# Patient Record
Sex: Male | Born: 1995 | Race: White | Hispanic: No | Marital: Single | Smoking: Never smoker
Health system: Southern US, Community
[De-identification: ages and names within clinical notes are randomized; demographics above are authoritative.]

## PROBLEM LIST (undated history)

## (undated) HISTORY — PX: COSMETIC SURGERY: SHX468

---
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
## 2017-01-31 ENCOUNTER — Ambulatory Visit (HOSPITAL_COMMUNITY)
Admission: AD | Admit: 2017-01-31 | Discharge: 2017-01-31 | Disposition: A | Discharge status: Home / Self Care | Payer: Medicaid Other | Attending: Psychiatry | Admitting: Psychiatry

## 2017-01-31 DIAGNOSIS — F339 Major depressive disorder, recurrent, unspecified: Secondary | ICD-10-CM | POA: Diagnosis present
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
## 2017-01-31 NOTE — H&P (Signed)
Behavioral Health Medical Screening Exam  Luke ReadingKrista N Ayala is an 21 y.o. male.  Total Time spent with patient: 45 minutes  Psychiatric Specialty Exam: Physical Exam  ROS  Blood pressure 132/75, pulse 73, temperature 98 F (36.7 C), temperature source Oral, resp. rate 18, SpO2 100 %.There is no height or weight on file to calculate BMI.  General Appearance: Casual  Eye Contact:  Good  Speech:  Clear and Coherent and Normal Rate  Volume:  Normal  Mood:  Anxious and Depressed  Affect:  Depressed  Thought Process:  Coherent and Goal Directed  Orientation:  Full (Time, Place, and Person)  Thought Content:  Logical  Suicidal Thoughts:  Passive but able to contract for safety staying with her aunt or uncle  Homicidal Thoughts:  No  Memory:  Immediate;   Good Recent;   Good Remote;   Good  Judgement:  Intact  Insight:  Good and Present  Psychomotor Activity:  Normal  Concentration: Concentration: Good and Attention Span: Good  Recall:  Good  Fund of Knowledge:Fair  Language: Good  Akathisia:  No  Handed:  Right  AIMS (if indicated):     Assets:  Communication Skills Desire for Improvement Housing Physical Health Social Support Vocational/Educational  Sleep:       Musculoskeletal: Strength & Muscle Tone: within normal limits Gait & Station: normal Patient leans: N/A  Blood pressure 132/75, pulse 73, temperature 98 F (36.7 C), temperature source Oral, resp. rate 18, SpO2 100 %.  Assessment:  Luke Ayala, 21 y.o., male patient [facility] with complaints of passive suicidal ideation.  Patient states that her stressor is the relationship with her mother.  States that she is able to contract for safety staying with her aunt or uncle and in interested in IOP or partial hospitalization.  At this time patient denies suicidal/homicidal ideation, psychosis, and paranoia.  Patient is able to contract for safety.  Patient's aunt at her side also states  that she is comfortable with patient coming home with her and that she will be with the patient at all time; patient will be able to go with her to work and once in touch with uncle patient will be able to stay with him and that they would keep her safe.    Patient is to [facility] for Partial Hospitalization intake assessment tomorrow at 1:30 pm.    Recommendations:  Referral to Partial Hospitalization or IOP.  Based on my evaluation the patient does not appear to have an emergency medical condition.  No evidence of imminent risk to self or others at present.   Patient does not meet criteria for psychiatric inpatient admission. Supportive therapy provided about ongoing stressors. Refer to IOP. Discussed crisis plan, support from social network, calling 911, coming to the Emergency Department, and calling Suicide Hotline.  Careli Luzader, NP 01/31/2017, 6:56 PM

## 2017-02-01 ENCOUNTER — Telehealth (HOSPITAL_COMMUNITY): Payer: Self-pay | Admitting: Professional

## 2017-02-01 ENCOUNTER — Ambulatory Visit (HOSPITAL_COMMUNITY): Payer: Medicaid Other | Admitting: Licensed Clinical Social Worker

## 2017-02-01 NOTE — Telephone Encounter (Signed)
Pt walked in from TTS. Cln read note to see TTS called pt to let her know apt was cancelled due to MCD ins not being accepted by PHP. Pt states she did not get msg b/c mom cut phone off. Cln gave [facility] and pt stated she would go now. [facility] and told her to f/u with cln if she needed something more. Pt states she felt safe to make it to [facility] and denies plan.

## 2017-08-14 ENCOUNTER — Ambulatory Visit (HOSPITAL_COMMUNITY)
Admission: EM | Admit: 2017-08-14 | Discharge: 2017-08-14 | Disposition: A | Discharge status: Home / Self Care | Payer: Medicaid Other | Attending: Family Medicine | Admitting: Family Medicine

## 2017-08-14 ENCOUNTER — Encounter (HOSPITAL_COMMUNITY): Payer: Self-pay | Admitting: Family Medicine

## 2017-08-14 DIAGNOSIS — S161XXA Strain of muscle, fascia and tendon at neck level, initial encounter: Secondary | ICD-10-CM | POA: Diagnosis not present

## 2017-08-14 DIAGNOSIS — R51 Headache: Secondary | ICD-10-CM

## 2017-08-14 DIAGNOSIS — R519 Headache, unspecified: Secondary | ICD-10-CM

## 2017-08-14 MED ORDER — CYCLOBENZAPRINE HCL 10 MG PO TABS: 10.0000 mg | ORAL_TABLET | Freq: Two times a day (BID) | ORAL | 0 refills | Status: AC | PRN

## 2017-08-14 MED ORDER — IBUPROFEN 800 MG PO TABS: 800.0000 mg | ORAL_TABLET | Freq: Three times a day (TID) | ORAL | 0 refills | Status: AC

## 2017-08-14 NOTE — Discharge Instructions (Signed)
Use anti-inflammatories for pain/swelling. You may take up to 800 mg Ibuprofen every 8 hours with food. You may supplement Ibuprofen with Tylenol [phone] mg every 8 hours.   You may use flexeril as needed to help with pain. This is a muscle relaxer and causes sedation- please use only at bedtime or when you will be home and not have to drive/work  Ice and heat pad to area of discomfort.   Follow up if symptoms not improving or worsening
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
## 2019-11-19 ENCOUNTER — Ambulatory Visit
Admission: RE | Admit: 2019-11-19 | Discharge: 2019-11-19 | Disposition: A | Discharge status: Home / Self Care | Payer: Medicaid Other | Source: Ambulatory Visit | Attending: Family Medicine | Admitting: Family Medicine

## 2019-11-19 ENCOUNTER — Other Ambulatory Visit: Payer: Self-pay | Admitting: Family Medicine

## 2019-11-19 ENCOUNTER — Other Ambulatory Visit: Payer: Self-pay

## 2019-11-19 DIAGNOSIS — R52 Pain, unspecified: Secondary | ICD-10-CM

## 2019-12-03 ENCOUNTER — Encounter (HOSPITAL_COMMUNITY): Payer: Self-pay

## 2019-12-03 ENCOUNTER — Ambulatory Visit (HOSPITAL_COMMUNITY)
Admission: EM | Admit: 2019-12-03 | Discharge: 2019-12-03 | Disposition: A | Discharge status: Home / Self Care | Payer: Self-pay | Attending: Family Medicine | Admitting: Family Medicine

## 2019-12-03 ENCOUNTER — Other Ambulatory Visit: Payer: Self-pay

## 2019-12-03 ENCOUNTER — Ambulatory Visit (INDEPENDENT_AMBULATORY_CARE_PROVIDER_SITE_OTHER): Payer: Self-pay

## 2019-12-03 DIAGNOSIS — M533 Sacrococcygeal disorders, not elsewhere classified: Secondary | ICD-10-CM

## 2019-12-03 DIAGNOSIS — S7000XA Contusion of unspecified hip, initial encounter: Secondary | ICD-10-CM

## 2019-12-03 NOTE — ED Triage Notes (Signed)
Pt presents with tailbone injury from a fall at work yesterday .

## 2019-12-03 NOTE — ED Provider Notes (Signed)
[facility]    CSN: [id number] Arrival date & time: 12/03/19  1804      History   Chief Complaint Chief Complaint  Patient presents with  . tailbone Injury    HPI Luke Ayala is a 24 y.o. adult.   Patient works at [employer].  Was feeling pools with hose and slipped on wet pavement sitting down rather harshly.  Now complains of pain in tailbone.  There is no pain in the lumbar spine or in her legs  HPI  History reviewed. No pertinent past medical history.  There are no problems to display for this patient.   Past Surgical History:  Procedure Laterality Date  . COSMETIC SURGERY      OB History   No obstetric history on file.      Home Medications    Prior to Admission medications   Medication Sig Start Date End Date Taking? Authorizing Provider  cyclobenzaprine (FLEXERIL) 10 MG tablet Take 1 tablet (10 mg total) by mouth 2 (two) times daily as needed for muscle spasms. 08/14/17   Wieters, Hallie C, PA-C  ibuprofen (ADVIL,MOTRIN) 800 MG tablet Take 1 tablet (800 mg total) by mouth 3 (three) times daily. 08/14/17   Wieters, Junius Creamer, PA-C    Family History Family History  Family history unknown: Yes    Social History Social History   Tobacco Use  . Smoking status: Never Smoker  Substance Use Topics  . Alcohol use: Not on file  . Drug use: Not on file     Allergies   Patient has no known allergies.   Review of Systems Review of Systems  Musculoskeletal: Negative for back pain.       Pain in tailbone  All other systems reviewed and are negative.    Physical Exam Triage Vital Signs ED Triage Vitals  Enc Vitals Group     BP 12/03/19 1928 115/71     Pulse Rate 12/03/19 1928 80     Resp 12/03/19 1928 16     Temp 12/03/19 1928 98.1 F (36.7 C)     Temp Source 12/03/19 1928 Oral     SpO2 12/03/19 1928 99 %     Weight --      Height --      Head Circumference --      Peak Flow --      Pain Score 12/03/19 1946 9     Pain Loc  --      Pain Edu? --      Excl. in GC? --    No data found.  Updated Vital Signs BP 115/71 (BP Location: Left Arm)   Pulse 80   Temp 98.1 F (36.7 C) (Oral)   Resp 16   SpO2 99%   Visual Acuity Right Eye Distance:   Left Eye Distance:   Bilateral Distance:    Right Eye Near:   Left Eye Near:    Bilateral Near:     Physical Exam Vitals reviewed.  Constitutional:      Appearance: Normal appearance.  Musculoskeletal:     Comments: Patient is tender over sacrum and coccyx No tenderness in the lumbar spine or in the legs.  Neurological:     Mental Status: He is alert.      UC Treatments / Results  Labs (all labs ordered are listed, but only abnormal results are displayed) Labs Reviewed - No data to display  EKG   Radiology No results found.  Procedures Procedures (  including critical care time)  Medications Ordered in UC Medications - No data to display  Initial Impression / Assessment and Plan / UC Course  I have reviewed the triage vital signs and the nursing notes.  Pertinent labs & imaging results that were available during my care of the patient were reviewed by me and considered in my medical decision making (see chart for details).     Contusion, coccyx Final Clinical Impressions(s) / UC Diagnoses   Final diagnoses:  None   Discharge Instructions   None    ED Prescriptions    None     PDMP not reviewed this encounter.   Frederica Kuster, MD 12/03/19 2124

## 2022-04-19 IMAGING — DX DG SACRUM/COCCYX 2+V
3 series · 3 of 3 positions shown · non-contrast
Comparison: None.

CLINICAL DATA: Fall, tailbone pain

EXAM:
SACRUM AND COCCYX - 2+ VIEW

[coccyx ap]
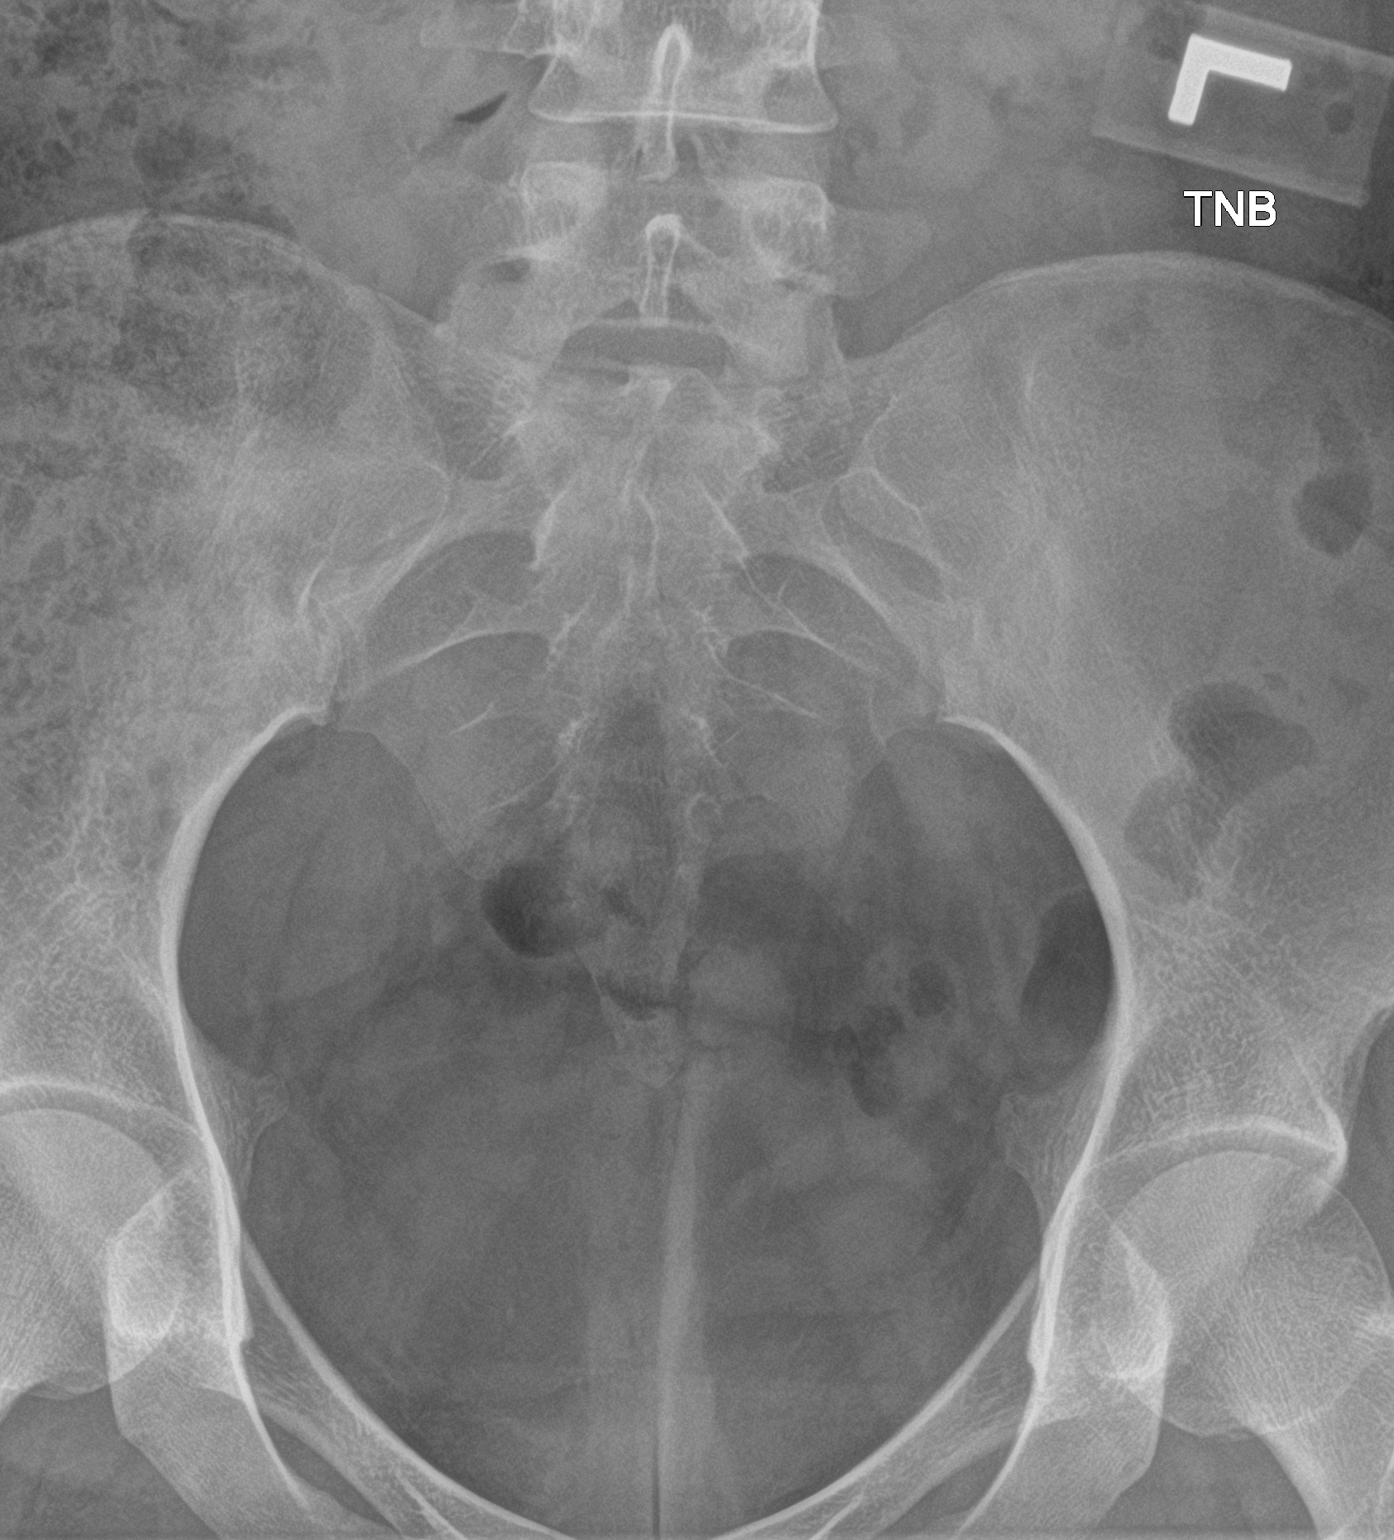

[sacrum ap]
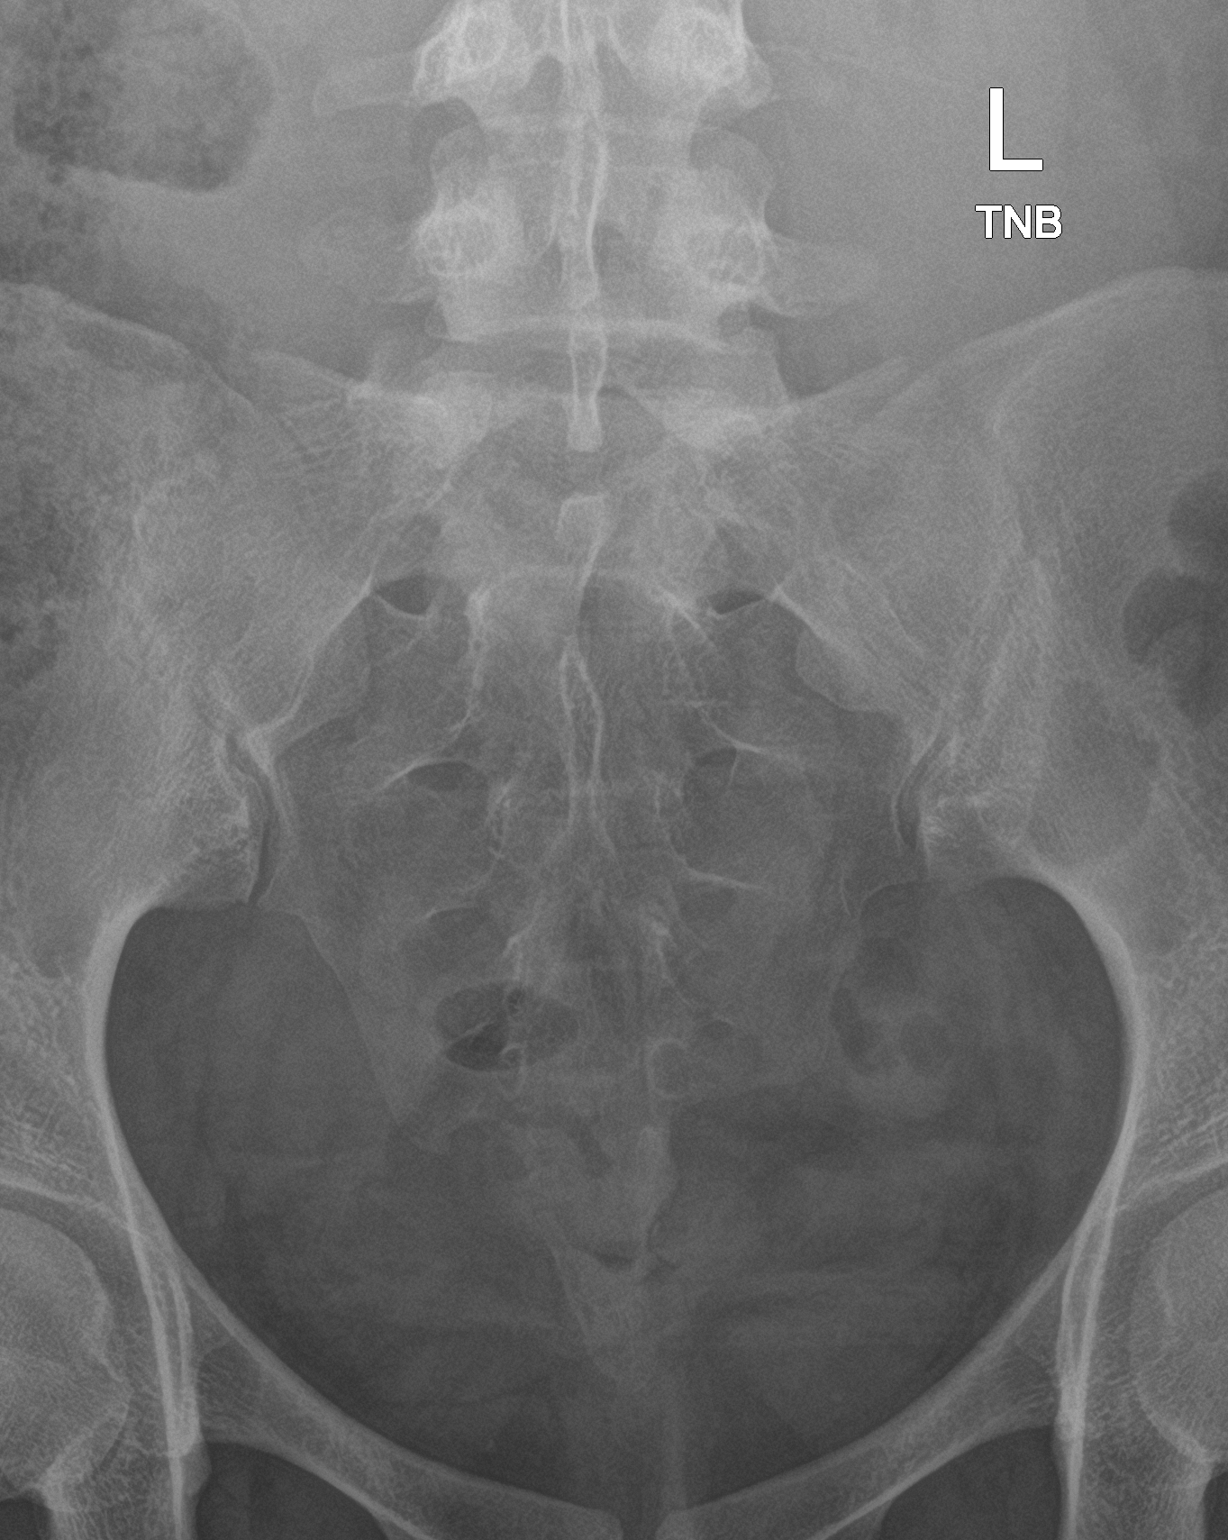

[sacrum lat]
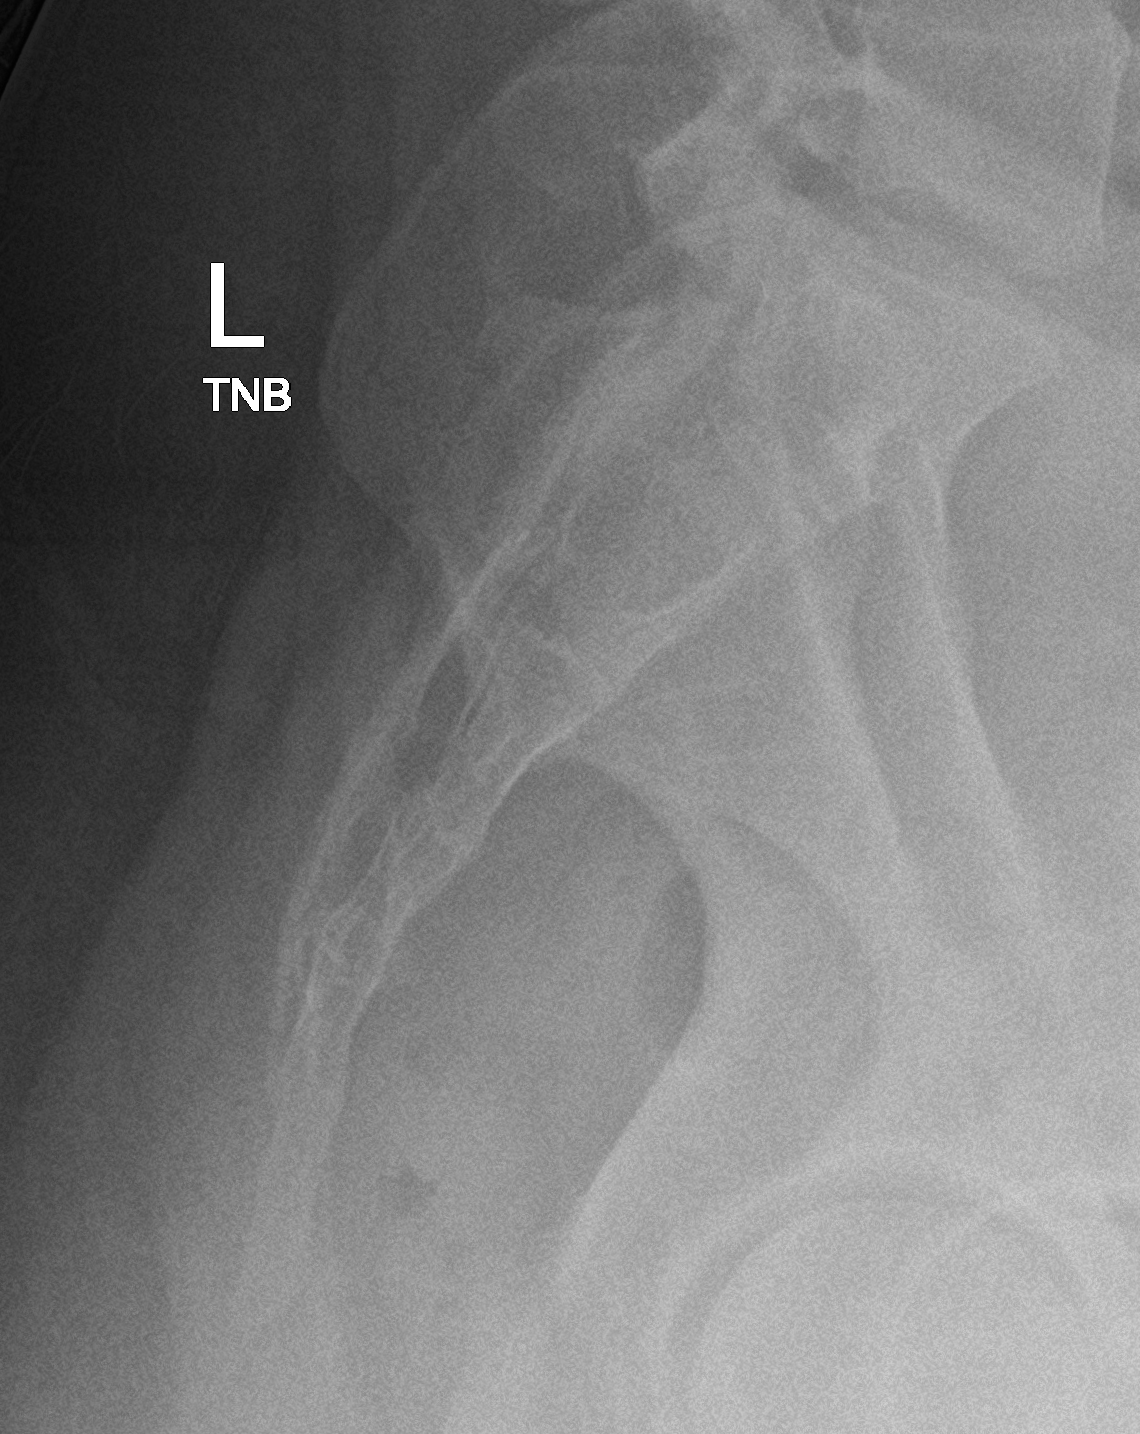

[3 of 3 positions shown; findings below may reference images not displayed]

FINDINGS: There is no evidence of fracture or other focal bone lesions.
IMPRESSION: Negative.
